# Patient Record
Sex: Male | Born: 1980 | Race: White | Hispanic: No | Marital: Single
Health system: Southern US, Community
[De-identification: ages and names within clinical notes are randomized; demographics above are authoritative.]

## PROBLEM LIST (undated history)

## (undated) DIAGNOSIS — E7800 Pure hypercholesterolemia, unspecified: Secondary | ICD-10-CM

---

## 2009-11-28 ENCOUNTER — Ambulatory Visit: Payer: Self-pay | Admitting: Family Medicine

## 2009-11-28 DIAGNOSIS — J019 Acute sinusitis, unspecified: Secondary | ICD-10-CM

## 2009-11-28 DIAGNOSIS — F329 Major depressive disorder, single episode, unspecified: Secondary | ICD-10-CM

## 2009-11-28 DIAGNOSIS — J309 Allergic rhinitis, unspecified: Secondary | ICD-10-CM | POA: Insufficient documentation

## 2010-08-29 NOTE — Assessment & Plan Note (Signed)
Summary: CHECKUP/JBB   Vital Signs:  Patient Profile:   30 Years Old Male CC:      Check-Up Height:     73.75 inches Weight:      222 pounds BMI:     28.80 Temp:     99.0 degrees F oral Pulse rate:   76 / minute Resp:     18 per minute BP sitting:   120 / 60  Vitals Entered By: Levonne Spiller EMT-P (Nov 28, 2009 1:08 PM)                  Current Allergies: No known allergies History of Present Illness Chief Complaint: Check-Up History of Present Illness: This information was handwritten and scanned into the chart, including HPI, PE, A/P.   Assessment New Problems: DEPRESSIVE DISORDER NOT ELSEWHERE CLASSIFIED (ICD-311) ACUTE SINUSITIS, UNSPECIFIED (ICD-461.9) ALLERGIC RHINITIS (ICD-477.9)   Plan New Orders: New Patient Level III [44034] Follow Up: Follow up in 2-3 days if no improvement, Follow up on an as needed basis, Follow up with Primary Physician  The patient and/or caregiver has been counseled thoroughly with regard to medications prescribed including dosage, schedule, interactions, rationale for use, and possible side effects and they verbalize understanding.  Diagnoses and expected course of recovery discussed and will return if not improved as expected or if the condition worsens. Patient and/or caregiver verbalized understanding.   THERE WERE COMPUTER PROBLEMS AND THE VISIT WAS DOCUMENTED ON PAPER AND THEN SCANNED INTO THE EMR.  DR. Purnell Shoemaker WAS THE PROVIDER FOR THIS ENCOUNTER.   PLEASE SEE HIS MEDICAL DOCUMENTATION OF THE VISIT. Rodney Langton, M.D., F.A.A.F.P.  Dec 02, 2009

## 2010-08-29 NOTE — Progress Notes (Signed)
Summary: Office Visit  Office Visit   Imported By: Chelsea Aus 11/28/2009 12:40:35  _____________________________________________________________________  External Attachment:    Type:   Image     Comment:   External Document

## 2011-11-13 ENCOUNTER — Telehealth: Payer: Self-pay | Admitting: Pulmonary Disease

## 2011-11-13 NOTE — Telephone Encounter (Signed)
Called and spoke with pt and he is aware that per SN---ok to add on for next aval.  pts father was a pt of SN--Gilbert Burke and pts sister is also seen by SN.  Pt is aware to bring in any meds that he takes and either bring in medical hx or have any faxed over prior to ov.  Pt voiced his understanding of this.

## 2011-11-13 NOTE — Telephone Encounter (Signed)
Called, spoke with pt.  He would like to know if Dr. Kriste Basque would make an exception and accept him as a pt.  States his family has a long hx with Dr. Kriste Basque - his father, Gilbert Burke, and sister, Gilbert Burke, both pts of SN's.  Feels Dr. Kriste Basque would be a great fit for him given his family's hx with pulmonary problems.  Dr. Kriste Basque, pls advise.  Thank you.

## 2011-12-19 ENCOUNTER — Ambulatory Visit: Payer: Self-pay | Admitting: Pulmonary Disease

## 2012-03-25 ENCOUNTER — Ambulatory Visit (INDEPENDENT_AMBULATORY_CARE_PROVIDER_SITE_OTHER): Payer: BC Managed Care – PPO | Admitting: Family Medicine

## 2012-03-25 ENCOUNTER — Encounter: Payer: Self-pay | Admitting: Family Medicine

## 2012-03-25 VITALS — BP 124/84 | HR 70 | Ht 73.0 in | Wt 207.0 lb

## 2012-03-25 DIAGNOSIS — F341 Dysthymic disorder: Secondary | ICD-10-CM

## 2012-03-25 DIAGNOSIS — Z23 Encounter for immunization: Secondary | ICD-10-CM

## 2012-03-25 DIAGNOSIS — J309 Allergic rhinitis, unspecified: Secondary | ICD-10-CM

## 2012-03-25 DIAGNOSIS — Z0000 Encounter for general adult medical examination without abnormal findings: Secondary | ICD-10-CM

## 2012-03-25 LAB — CBC WITH DIFFERENTIAL/PLATELET
HCT: 45.8 % (ref 39.0–52.0)
Hemoglobin: 15.8 g/dL (ref 13.0–17.0)
Lymphs Abs: 2.4 10*3/uL (ref 0.7–4.0)
MCH: 30.6 pg (ref 26.0–34.0)
MCHC: 34.5 g/dL (ref 30.0–36.0)
Monocytes Absolute: 0.8 10*3/uL (ref 0.1–1.0)
Monocytes Relative: 9 % (ref 3–12)
Neutro Abs: 6 10*3/uL (ref 1.7–7.7)
Neutrophils Relative %: 64 % (ref 43–77)
RBC: 5.17 MIL/uL (ref 4.22–5.81)

## 2012-03-25 LAB — LIPID PANEL
Cholesterol: 247 mg/dL — ABNORMAL HIGH (ref 0–200)
HDL: 46 mg/dL (ref >39)
LDL Cholesterol: 174 mg/dL — ABNORMAL HIGH (ref 0–99)
Triglycerides: 137 mg/dL (ref <150)

## 2012-03-25 LAB — COMPREHENSIVE METABOLIC PANEL
Albumin: 4.8 g/dL (ref 3.5–5.2)
BUN: 18 mg/dL (ref 6–23)
CO2: 26 mEq/L (ref 19–32)
Calcium: 10.1 mg/dL (ref 8.4–10.5)
Glucose, Bld: 88 mg/dL (ref 70–99)
Potassium: 4.3 mEq/L (ref 3.5–5.3)
Sodium: 139 mEq/L (ref 135–145)
Total Protein: 7.2 g/dL (ref 6.0–8.3)

## 2012-03-25 LAB — POCT URINALYSIS DIPSTICK
Bilirubin, UA: NEGATIVE
Blood, UA: NEGATIVE
Glucose, UA: NEGATIVE
Ketones, UA: NEGATIVE
Leukocytes, UA: NEGATIVE
Nitrite, UA: NEGATIVE
pH, UA: 7

## 2012-03-25 NOTE — Progress Notes (Signed)
Subjective:    Patient ID: Gilbert Burke, male    DOB: July 09, 1981, 31 y.o.   MRN: [id number]  HPI He is here for complete examination. He started running 5 weeks ago preparing for a half marathon October 14. He has now lost 20 pounds. He is running approximately 20 miles per week. He does have good fitting shoes. He did have difficulty with patellar pain after about 2 weeks but did change his running style and also change the amount of hills he was doing and this improved. He has been doing reading on proper training for a half marathon and does seem to have a good handle on this. He has a history consistent with dysthymia having several years worth of negative thoughts and ideas but no suicidal ideation. He did respond quite nicely to Lexapro. He also stopped drinking and using marijuana to also helped. He does have underlying allergies and uses OTC antihistamines with good results. Socially he is in between girlfriends right now and is doing fine  Review of Systems     Objective:   Physical Exam BP 124/84  Pulse 70  Ht 6\' 1"  (1.854 m)  Wt 207 lb (93.895 kg)  BMI 27.31 kg/m2  SpO2 99%  General Appearance:    Alert, cooperative, no distress, appears stated age  Head:    Normocephalic, without obvious abnormality, atraumatic  Eyes:    PERRL, conjunctiva/corneas clear, EOM's intact, fundi    benign  Ears:    Normal TM's and external ear canals  Nose:   Nares normal, mucosa normal, no drainage or sinus   tenderness  Throat:   Lips, mucosa, and tongue normal; teeth and gums normal  Neck:   Supple, no lymphadenopathy;  thyroid:  no   enlargement/tenderness/nodules; no carotid   bruit or JVD  Back:    Spine nontender, no curvature, ROM normal, no CVA     tenderness  Lungs:     Clear to auscultation bilaterally without wheezes, rales or     ronchi; respirations unlabored  Chest Wall:    No tenderness or deformity   Heart:    Regular rate and rhythm, S1 and S2 normal, no murmur, rub   or  gallop  Breast Exam:    No chest wall tenderness, masses or gynecomastia  Abdomen:     Soft, non-tender, nondistended, normoactive bowel sounds,    no masses, no hepatosplenomegaly  Genitalia:    Normal male external genitalia without lesions.  Testicles without masses.  No inguinal hernias.  Rectal:   Deferred due to age <40 and lack of symptoms  Extremities:   No clubbing, cyanosis or edema  Pulses:   2+ and symmetric all extremities  Skin:   Skin color, texture, turgor normal, no rashes or lesions  Lymph nodes:   Cervical, supraclavicular, and axillary nodes normal  Neurologic:   CNII-XII intact, normal strength, sensation and gait; reflexes 2+ and symmetric throughout          Psych:   Normal mood, affect, hygiene and grooming.           Assessment & Plan:   1. Routine general medical examination at a health care facility  POCT Urinalysis Dipstick, CBC with Differential, Comprehensive metabolic panel, Lipid panel  2. Dysthymia    3. Allergic rhinitis, mild    4. Immunization due  Tdap vaccine greater than or equal to 7yo IM   I long discussion with him concerning proper training for a half marathon are marathon. He  does have a good handle on this. Discussed interval training, proper shoes upper running surface and a running style with him. He is also due for an immunization. His discussed immunization and possible side effects as well as benefits.

## 2012-03-25 NOTE — Patient Instructions (Signed)
Continue to running but make sure you take one or 2 days off per week. May treat her shoes fit properly as well as a running surface. Try to avoid hills for right now.

## 2012-04-09 ENCOUNTER — Telehealth: Payer: Self-pay | Admitting: Family Medicine

## 2012-04-10 ENCOUNTER — Telehealth: Payer: Self-pay | Admitting: Family Medicine

## 2012-04-10 NOTE — Telephone Encounter (Signed)
Left message its ok to set up a lab only appt to have cholesterol drawn and results come back will call and let him know if he needs to discuss them with [facility]

## 2012-04-10 NOTE — Telephone Encounter (Signed)
Make sure he sets up an appointment

## 2012-04-11 MED ORDER — ESCITALOPRAM OXALATE 10 MG PO TABS: 10.0000 mg | ORAL_TABLET | Freq: Every day | ORAL | Status: DC

## 2012-04-11 NOTE — Telephone Encounter (Signed)
Lexapro renewed.

## 2012-06-19 ENCOUNTER — Ambulatory Visit: Payer: BC Managed Care – PPO | Admitting: Family Medicine

## 2012-06-25 ENCOUNTER — Ambulatory Visit: Payer: BC Managed Care – PPO | Admitting: Family Medicine

## 2012-08-27 ENCOUNTER — Telehealth: Payer: Self-pay | Admitting: Internal Medicine

## 2012-08-27 NOTE — Telephone Encounter (Signed)
Pt would like to be referred to a psychiatrist. Pt states he has been going through a lot and lost his dad last year and lost his job and is in transition with a new career. And he had a episode Monday where he was addmitted to [facility] Monday. And just wanted to be referred to a psychiatrist.

## 2012-08-27 NOTE — Telephone Encounter (Signed)
Pt left message on my voice mail that he would like a referral to Psych as he is having [employer].  Pt phone [phone]  I did call pt back and gave him [facility] phone [phone]

## 2012-09-19 ENCOUNTER — Emergency Department (HOSPITAL_COMMUNITY): Payer: BC Managed Care – PPO

## 2012-09-19 ENCOUNTER — Emergency Department (HOSPITAL_COMMUNITY)
Admission: EM | Admit: 2012-09-19 | Discharge: 2012-09-20 | Disposition: A | Discharge status: Home / Self Care | Payer: BC Managed Care – PPO | Attending: Emergency Medicine | Admitting: Emergency Medicine

## 2012-09-19 ENCOUNTER — Encounter (HOSPITAL_COMMUNITY): Payer: Self-pay | Admitting: Emergency Medicine

## 2012-09-19 DIAGNOSIS — E7800 Pure hypercholesterolemia, unspecified: Secondary | ICD-10-CM | POA: Insufficient documentation

## 2012-09-19 DIAGNOSIS — Z87891 Personal history of nicotine dependence: Secondary | ICD-10-CM | POA: Insufficient documentation

## 2012-09-19 DIAGNOSIS — R002 Palpitations: Secondary | ICD-10-CM | POA: Insufficient documentation

## 2012-09-19 DIAGNOSIS — Z79899 Other long term (current) drug therapy: Secondary | ICD-10-CM | POA: Insufficient documentation

## 2012-09-19 HISTORY — DX: Pure hypercholesterolemia, unspecified: E78.00

## 2012-09-19 LAB — POCT I-STAT, CHEM 8
BUN: 24 mg/dL — ABNORMAL HIGH (ref 6–23)
Calcium, Ion: 1.11 mmol/L — ABNORMAL LOW (ref 1.12–1.23)
Chloride: 108 mEq/L (ref 96–112)
Creatinine, Ser: 0.9 mg/dL (ref 0.50–1.35)

## 2012-09-19 LAB — CBC
HCT: 41.2 % (ref 39.0–52.0)
Hemoglobin: 14.1 g/dL (ref 13.0–17.0)
MCH: 30.7 pg (ref 26.0–34.0)
MCV: 89.6 fL (ref 78.0–100.0)
Platelets: 210 10*3/uL (ref 150–400)
RBC: 4.6 MIL/uL (ref 4.22–5.81)
WBC: 9 10*3/uL (ref 4.0–10.5)

## 2012-09-19 LAB — POCT I-STAT TROPONIN I: Troponin i, poc: 0 ng/mL (ref 0.00–0.08)

## 2012-09-19 NOTE — ED Provider Notes (Signed)
History     CSN: [id number]  Arrival date & time 09/19/12  2115   First MD Initiated Contact with Patient 09/19/12 2230      Chief Complaint  Patient presents with  . Palpitations   HPI  History provided by the patient. Patient is a 32 year old male with history of hypercholesterolemia that is diet controlled who presents with complaints of heart palpitations. Patient has had noticed heart palpitations described as a pounding heartbeat for the past 2 weeks but worse over past 3 days. He notices this more often at rest when lying down. He denies having a rapid or a fast heartbeat. Denies any skipped beats. Patient states this is been happening more frequently since he began training for his second half marathon. He denies any palpitations or complaints while exercising. There are no other associated symptoms. He denies pain, SOB, nausea or diaphoresis. Patient states he has been staying hydrated for history pain. He does report a 40 pound weight loss over the past 10 weeks. Patient also reports having some additional stress. His father passed away recently and he began prepatellar swelling. He also separated from his girlfriend of the past 6 years. He denies prior history of anxiety or panic attacks. No family history of early or sudden death.    Past Medical History  Diagnosis Date  . Hypercholesteremia     History reviewed. No pertinent past surgical history.  No family history on file.  History  Substance Use Topics  . Smoking status: Former Smoker    Quit date: 02/17/2012  . Smokeless tobacco: Not on file  . Alcohol Use: No      Review of Systems  Constitutional: Negative for fever, chills and diaphoresis.  Respiratory: Negative for cough and shortness of breath.   Cardiovascular: Positive for palpitations. Negative for chest pain and leg swelling.  Gastrointestinal: Negative for nausea.  Neurological: Negative for dizziness and light-headedness.  All other systems  reviewed and are negative.    Allergies  Review of patient's allergies indicates no known allergies.  Home Medications   Current Outpatient Rx  Name  Route  Sig  Dispense  Refill  . escitalopram (LEXAPRO) 10 MG tablet   Oral   Take 10 mg by mouth every morning.         . Multiple Vitamin (MULTIVITAMIN WITH MINERALS) TABS   Oral   Take 1 tablet by mouth every morning.           BP 146/77  Pulse 67  Temp(Src) 97.7 F (36.5 C) (Oral)  Resp 15  SpO2 99%  Physical Exam  Nursing note and vitals reviewed. Constitutional: He is oriented to person, place, and time. He appears well-developed and well-nourished. No distress.  HENT:  Head: Normocephalic.  Neck: Normal range of motion. Neck supple. No thyromegaly present.  Cardiovascular: Normal rate and regular rhythm.   No murmur heard. Pulmonary/Chest: Breath sounds normal. No respiratory distress. He has no wheezes. He has no rales.  Abdominal: Soft.  Musculoskeletal: Normal range of motion. He exhibits no edema and no tenderness.  Neurological: He is alert and oriented to person, place, and time.  Skin: Skin is warm.  Psychiatric: He has a normal mood and affect. His behavior is normal.    ED Course  Procedures   Results for orders placed during the hospital encounter of 09/19/12  CBC      Result Value Range   WBC 9.0  4.0 - 10.5 K/uL   RBC 4.60  4.22 -  5.81 MIL/uL   Hemoglobin 14.1  13.0 - 17.0 g/dL   HCT 91.4  78.2 - 95.6 %   MCV 89.6  78.0 - 100.0 fL   MCH 30.7  26.0 - 34.0 pg   MCHC 34.2  30.0 - 36.0 g/dL   RDW 21.3  08.6 - 57.8 %   Platelets 210  150 - 400 K/uL  POCT I-STAT, CHEM 8      Result Value Range   Sodium 142  135 - 145 mEq/L   Potassium 3.8  3.5 - 5.1 mEq/L   Chloride 108  96 - 112 mEq/L   BUN 24 (*) 6 - 23 mg/dL   Creatinine, Ser 4.69  0.50 - 1.35 mg/dL   Glucose, Bld 629 (*) 70 - 99 mg/dL   Calcium, Ion 5.28 (*) 1.12 - 1.23 mmol/L   TCO2 24  0 - 100 mmol/L   Hemoglobin 13.9  13.0 -  17.0 g/dL   HCT 41.3  24.4 - 01.0 %  POCT I-STAT TROPONIN I      Result Value Range   Troponin i, poc 0.00  0.00 - 0.08 ng/mL   Comment 3                Dg Chest 2 View  09/19/2012  *RADIOLOGY REPORT*  Clinical Data: Palpitations.  CHEST - 2 VIEW  Comparison: No priors.  Findings: Lung volumes are normal.  No consolidative airspace disease.  No pleural effusions.  No pneumothorax.  No pulmonary nodule or mass noted.  Pulmonary vasculature and the cardiomediastinal silhouette are within normal limits.  IMPRESSION: 1. No radiographic evidence of acute cardiopulmonary disease.   Original Report Authenticated By: Trudie Reed, M.D.      1. Palpitations       MDM  Patient seen and evaluated. Patient resting comfortably appears well in no acute distress. Vital signs reviewed and unremarkable.      Date: 09/20/2012  Rate: 74  Rhythm: normal sinus rhythm  QRS Axis: normal  Intervals: normal  ST/T Wave abnormalities: nonspecific ST/T changes  Conduction Disutrbances:none  Narrative Interpretation:   Old EKG Reviewed: none available    Angus Seller, PA 09/20/12 0025

## 2012-09-19 NOTE — ED Notes (Addendum)
Patient training for marathon and mountain climbing. Patient states the past 3 days he has noticed that he could "feel his heart beating against his chest", which gives him anxiety that something that may be wrong with his heart. Patient states he knows his heart rate goes up when training to 180-185 when working out, but that notices symptoms when he is laying at home, or sitting still. Patient reports he takes multivitamin and lexapro which he has been on for >3 years. Patient states that last year he lost his father to cancer and recently had a break up of a 6 year relationship, as well as a significant change in his employment. Patient states that he has recently started seeing a grief counselor r/t the loss of his father.

## 2012-09-19 NOTE — ED Notes (Signed)
Pt c/o palpitations x 3 days. Pt states he is training for marathon, he feels HR is higher than it should be. Pt states heart is beating extremily hard. Denies CP, denies SHOB, denies nausea. Pt states he came in d/t becoming "clammy" at home.

## 2012-09-19 NOTE — Discharge Instructions (Signed)
You were seen and evaluated for your symptoms of heart palpitations. At this time your lab tests, EKG of your heart and chest x-ray have not shown any signs for concerning or emergent cause to explain your symptoms. Please followup with your primary care provider on Monday for continued evaluation.    Palpitations  A palpitation is the feeling that your heartbeat is irregular or is faster than normal. It may feel like your heart is fluttering or skipping a beat. Palpitations are usually not a serious problem. However, in some cases, you may need further medical evaluation. CAUSES  Palpitations can be caused by:  Smoking.  Caffeine or other stimulants, such as diet pills or energy drinks.  Alcohol.  Stress and anxiety.  Strenuous physical activity.  Fatigue.  Certain medicines.  Heart disease, especially if you have a history of arrhythmias. This includes atrial fibrillation, atrial flutter, or supraventricular tachycardia.  An improperly working pacemaker or defibrillator. DIAGNOSIS  To find the cause of your palpitations, your caregiver will take your history and perform a physical exam. Tests may also be done, including:  Electrocardiography (ECG). This test records the heart's electrical activity.  Cardiac monitoring. This allows your caregiver to monitor your heart rate and rhythm in real time.  Holter monitor. This is a portable device that records your heartbeat and can help diagnose heart arrhythmias. It allows your caregiver to track your heart activity for several days, if needed.  Stress tests by exercise or by giving medicine that makes the heart beat faster. TREATMENT  Treatment of palpitations depends on the cause of your symptoms and can vary greatly. Most cases of palpitations do not require any treatment other than time, relaxation, and monitoring your symptoms. Other causes, such as atrial fibrillation, atrial flutter, or supraventricular tachycardia, usually  require further treatment. HOME CARE INSTRUCTIONS   Avoid:  Caffeinated coffee, tea, soft drinks, diet pills, and energy drinks.  Chocolate.  Alcohol.  Stop smoking if you smoke.  Reduce your stress and anxiety. Things that can help you relax include:  A method that measures bodily functions so you can learn to control them (biofeedback).  Yoga.  Meditation.  Physical activity such as swimming, jogging, or walking.  Get plenty of rest and sleep. SEEK MEDICAL CARE IF:   You continue to have a fast or irregular heartbeat beyond 24 hours.  Your palpitations occur more often. SEEK IMMEDIATE MEDICAL CARE IF:  You develop chest pain or shortness of breath.  You have a severe headache.  You feel dizzy, or you faint. MAKE SURE YOU:  Understand these instructions.  Will watch your condition.  Will get help right away if you are not doing well or get worse. Document Released: 07/13/2000 Document Revised: 01/15/2012 Document Reviewed: 09/14/2011 [facility] Patient Information 2013 [place], [place].

## 2012-09-22 NOTE — ED Provider Notes (Signed)
Medical screening examination/treatment/procedure(s) were performed by non-physician practitioner and as supervising physician I was immediately available for consultation/collaboration.  Garyson Stelly M Karmin Kasprzak, MD 09/22/12 1908 

## 2012-09-23 ENCOUNTER — Ambulatory Visit (INDEPENDENT_AMBULATORY_CARE_PROVIDER_SITE_OTHER): Payer: BC Managed Care – PPO | Admitting: Family Medicine

## 2012-09-23 ENCOUNTER — Encounter: Payer: Self-pay | Admitting: Family Medicine

## 2012-09-23 ENCOUNTER — Telehealth: Payer: Self-pay | Admitting: Internal Medicine

## 2012-09-23 VITALS — BP 122/80 | HR 81 | Wt 198.0 lb

## 2012-09-23 DIAGNOSIS — F329 Major depressive disorder, single episode, unspecified: Secondary | ICD-10-CM

## 2012-09-23 DIAGNOSIS — E785 Hyperlipidemia, unspecified: Secondary | ICD-10-CM

## 2012-09-23 DIAGNOSIS — F32A Depression, unspecified: Secondary | ICD-10-CM

## 2012-09-23 DIAGNOSIS — S6000XA Contusion of unspecified finger without damage to nail, initial encounter: Secondary | ICD-10-CM

## 2012-09-23 NOTE — Patient Instructions (Signed)
Gilbert Burke tape the finger

## 2012-09-23 NOTE — Telephone Encounter (Signed)
Call the patient back

## 2012-09-23 NOTE — Telephone Encounter (Signed)
pt would like to come in, in 2-3 months to get his lipids checked again just to see if they are down from the last time he was in. he has changed his lifestyle and would like to see where he is at. if you will approve this? will you put in future orders for pt to come in like may for labs. i will call pt if you approve and set that up

## 2012-09-23 NOTE — Progress Notes (Signed)
  Subjective:    Patient ID: Gilbert Burke, male    DOB: [birth year], 32 y.o.   MRN: [id number]  HPI He is here for consultation concerning an injury to his left index finger.the injury occurred on 09/22/12. He also is involved in counseling to help deal with underlying depression revolving around the loss of his father and the recent breakup with his girlfriend.she was also recently seen in the emergency room for evaluation of palpitations. That record was reviewed.   Review of Systems     Objective:   Physical Exam Alert and in no distress. Slight tenderness to palpation of the tip of the finger however the DIP joint and PIP joints are normal. No swelling or ecchymosis noted.       Assessment & Plan:  Finger contusion, initial encounter  Depression supportive care for the finger contusion since its at the very tip. He is comfortable with this. We also then spent the majority of the time discussing depression and how he is handling this. He started into a regular exercise program and does plan to climb [place] in August. He is now gaining insight into dealing better with death and dying. Encouraged him to continue in counseling.

## 2012-09-23 NOTE — Telephone Encounter (Signed)
I Called pt and left message that he can call back and schedule his lab visit in the next 2-3 months for his lipids that dr. Susann Givens approved it.

## 2012-10-06 ENCOUNTER — Other Ambulatory Visit: Payer: Self-pay | Admitting: Family Medicine

## 2012-10-06 NOTE — Telephone Encounter (Signed)
Is this okay?

## 2012-10-07 ENCOUNTER — Ambulatory Visit (INDEPENDENT_AMBULATORY_CARE_PROVIDER_SITE_OTHER): Payer: BC Managed Care – PPO | Admitting: Family Medicine

## 2012-10-07 ENCOUNTER — Encounter: Payer: Self-pay | Admitting: Family Medicine

## 2012-10-07 VITALS — BP 116/70 | HR 55 | Temp 98.1°F | Wt 197.0 lb

## 2012-10-07 DIAGNOSIS — R69 Illness, unspecified: Secondary | ICD-10-CM

## 2012-10-07 DIAGNOSIS — J309 Allergic rhinitis, unspecified: Secondary | ICD-10-CM

## 2012-10-07 DIAGNOSIS — F329 Major depressive disorder, single episode, unspecified: Secondary | ICD-10-CM

## 2012-10-07 MED ORDER — TRIAMCINOLONE ACETONIDE(NASAL) 55 MCG/ACT NA INHA: 2.0000 | Freq: Every day | NASAL | Status: AC

## 2012-10-07 NOTE — Progress Notes (Signed)
  Subjective:    Patient ID: Gilbert Burke, male    DOB: 06/12/1981, 32 y.o.   MRN: [id number]  HPI Has a one-week history that started with sinus pressure followed by headache, itchy watery eyes, rhinorrhea but no PND. He does have a previous history of seasonal allergic rhinitis and he usually takes Careers adviser. He is also taking Advil cold and sinus as well as Sudafed and Flonase. He does not smoke. He also continues to do quite well on his Celexa. He continues in counseling and is making good progress. He now realizes that he has had low-grade depression his entire life. He states that the Celexa is making him feel much better.  Review of Systems     Objective:   Physical Exam alert and in no distress. Tympanic membranes and canals are normal. Throat is clear. Tonsils are normal. Neck is supple without adenopathy or thyromegaly. Cardiac exam shows a regular sinus rhythm without murmurs or gallops. Lungs are clear to auscultation.        Assessment & Plan:  Depressive disorder, not elsewhere classified  ALLERGIC RHINITIS - Plan: triamcinolone (NASACORT AQ) 55 MCG/ACT nasal inhaler also recommend that he switch to Allegra. If he has further difficulties, I might possibly add Singulair. He will continue in counseling and on his Celexa.

## 2012-10-08 NOTE — Progress Notes (Signed)
  Subjective:    Patient ID: Gilbert Burke, male    DOB: July 14, 1981, 32 y.o.   MRN: [id number]  HPI    Review of Systems     Objective:   Physical Exam        Assessment & Plan:  Patient not seen.

## 2012-10-30 ENCOUNTER — Other Ambulatory Visit: Payer: Self-pay | Admitting: Family Medicine

## 2012-12-01 ENCOUNTER — Other Ambulatory Visit: Payer: BC Managed Care – PPO

## 2012-12-03 ENCOUNTER — Other Ambulatory Visit: Payer: BC Managed Care – PPO

## 2012-12-03 VITALS — BP 112/60 | HR 62

## 2012-12-03 DIAGNOSIS — E785 Hyperlipidemia, unspecified: Secondary | ICD-10-CM

## 2012-12-03 LAB — LIPID PANEL
HDL: 44 mg/dL (ref >39)
LDL Cholesterol: 118 mg/dL — ABNORMAL HIGH (ref 0–99)
Total CHOL/HDL Ratio: 4.2 Ratio
Triglycerides: 114 mg/dL (ref <150)

## 2012-12-04 NOTE — Progress Notes (Signed)
Quick Note:  Pt advised and verbalized understanding ______ 

## 2013-06-29 ENCOUNTER — Telehealth: Payer: Self-pay | Admitting: Family Medicine

## 2013-06-29 NOTE — Telephone Encounter (Signed)
Go ahead and call in one month supply in.

## 2013-06-29 NOTE — Telephone Encounter (Signed)
Pt left message Friday that he has moved to [place] and wants you to tell him who a [place].  He needs one soon as will need a refill.  He wants one that teaches like you.  Please call pt [phone]

## 2013-06-29 NOTE — Telephone Encounter (Signed)
Pt called for refill for Generic Lexapro to [facility] in [place] of [phone].  He did not know the street or the phone number.  He hopes to get in with his new doc in January.  He has 1 week of rx left.  Pt ph [phone]

## 2013-06-29 NOTE — Telephone Encounter (Signed)
PT INFORMED

## 2013-06-29 NOTE — Telephone Encounter (Signed)
Have him call Dr. Elmer Bales [phone]

## 2013-06-30 ENCOUNTER — Other Ambulatory Visit: Payer: Self-pay

## 2013-06-30 MED ORDER — ESCITALOPRAM OXALATE 10 MG PO TABS: ORAL_TABLET | ORAL | Status: DC

## 2013-06-30 NOTE — Telephone Encounter (Signed)
Pt called for refill for Generic Lexapro to [facility] in [place] with a zip of [phone]. He did not know the street or the phone number. He hopes to get in with his new doc in January. He has 1 week of rx left. Pt ph [phone]    i sent the rx to the only [facility] with a zip code near [phone]

## 2013-08-03 ENCOUNTER — Other Ambulatory Visit: Payer: Self-pay | Admitting: Family Medicine

## 2013-08-07 ENCOUNTER — Telehealth: Payer: Self-pay | Admitting: Family Medicine

## 2013-08-07 MED ORDER — ESCITALOPRAM OXALATE 10 MG PO TABS: ORAL_TABLET | ORAL | Status: AC

## 2013-08-07 NOTE — Telephone Encounter (Signed)
Lexapro renewed for one month

## 2014-01-24 IMAGING — CR DG CHEST 2V
2 series · 2 of 2 positions shown · non-contrast
Comparison: No priors.

CLINICAL DATA: Palpitations.

CHEST - 2 VIEW

[w chest pa]
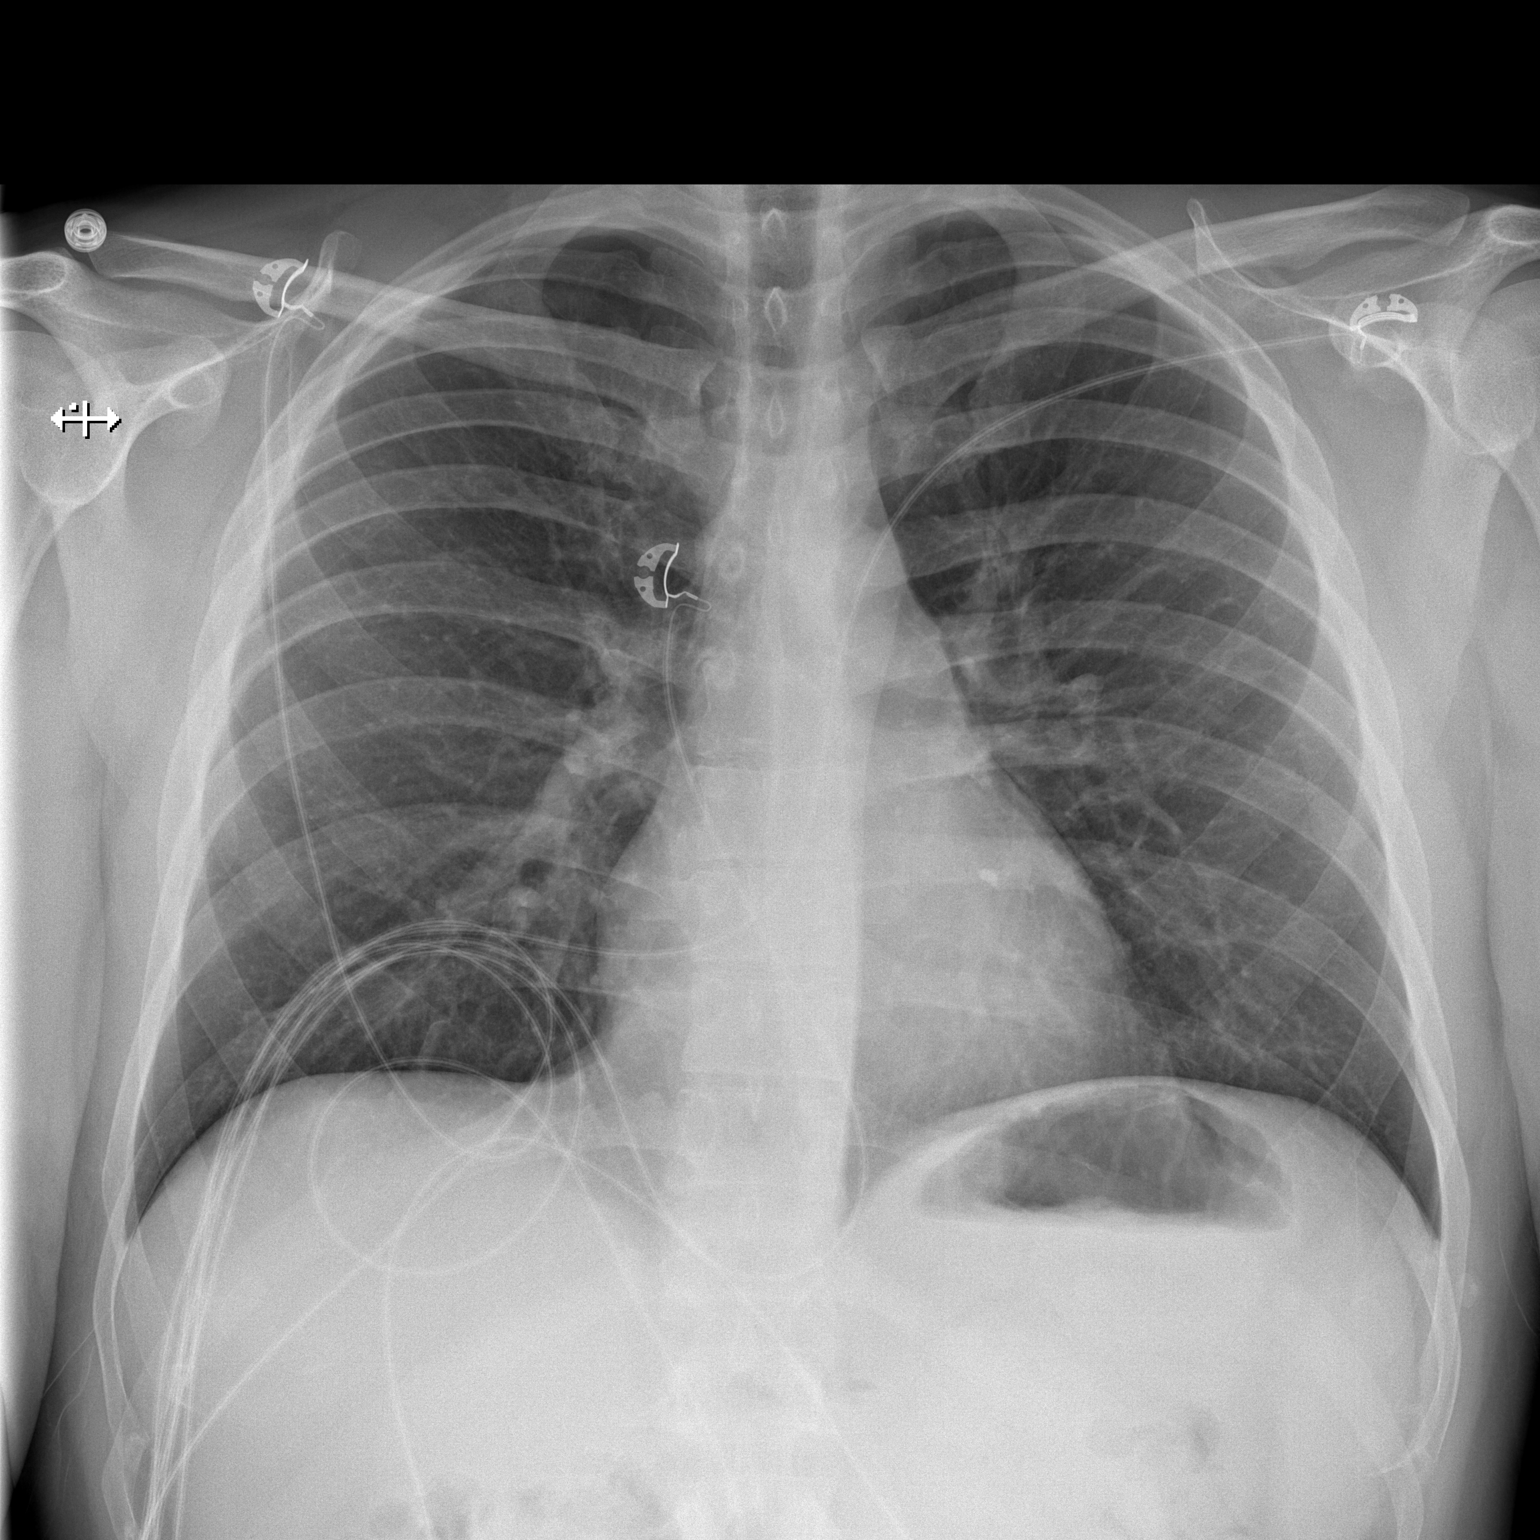

[w chest lat]
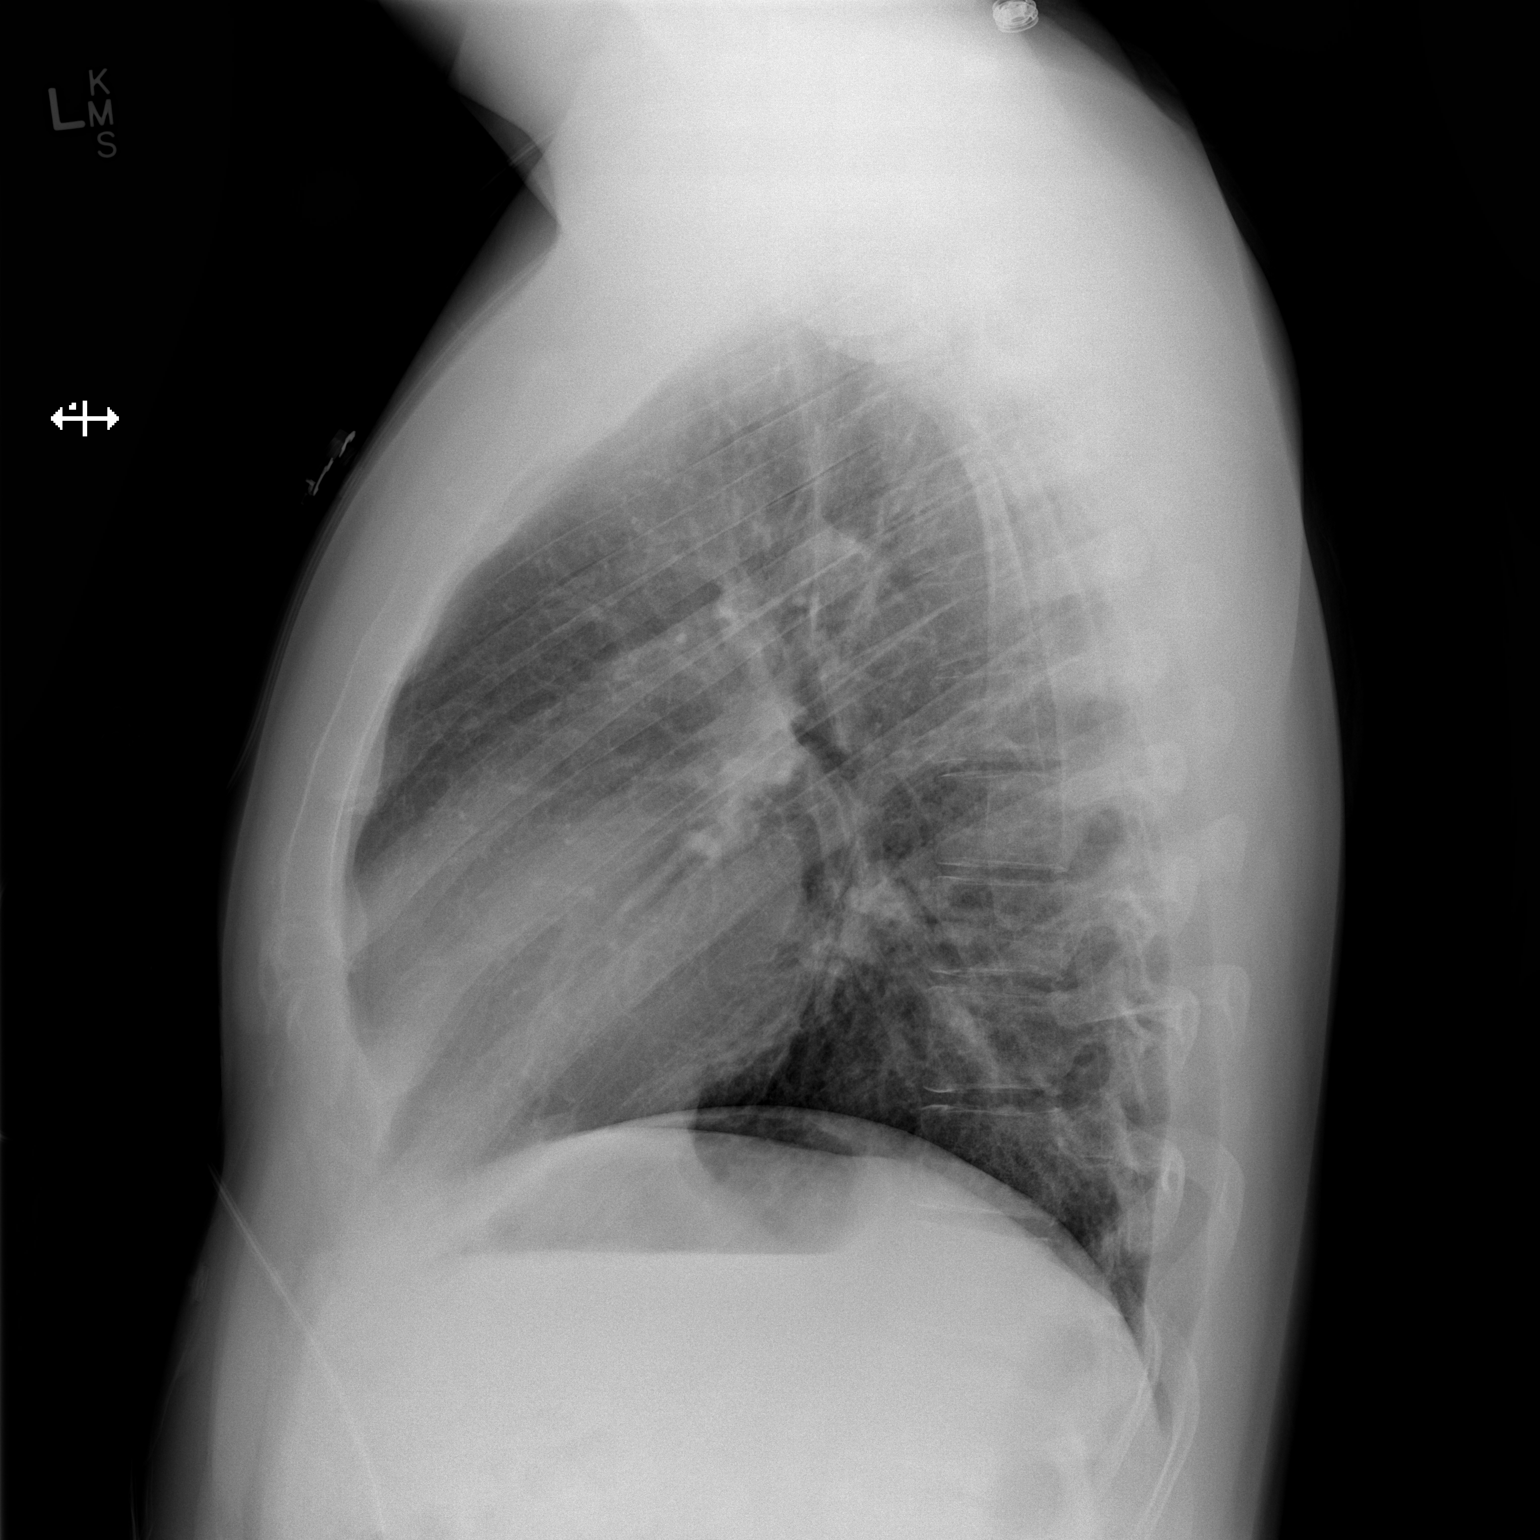

[2 of 2 positions shown; findings below may reference images not displayed]

FINDINGS: Lung volumes are normal.  No consolidative airspace
disease.  No pleural effusions.  No pneumothorax.  No pulmonary
nodule or mass noted.  Pulmonary vasculature and the
cardiomediastinal silhouette are within normal limits.
IMPRESSION: 1. No radiographic evidence of acute cardiopulmonary disease.

## 2022-08-07 ENCOUNTER — Encounter: Payer: Self-pay | Admitting: Internal Medicine

## 2022-08-09 ENCOUNTER — Ambulatory Visit: Payer: Medicaid Other | Admitting: Family Medicine

## 2023-09-24 ENCOUNTER — Encounter: Payer: Self-pay | Admitting: Internal Medicine
